# Patient Record
Sex: Male | Born: 1985 | Race: Black or African American | Hispanic: No | Smoking: Current every day smoker
Health system: Southern US, Community
[De-identification: ages and names within clinical notes are randomized; demographics above are authoritative.]

## PROBLEM LIST (undated history)

## (undated) HISTORY — PX: KELOID EXCISION: SHX1856

## (undated) HISTORY — PX: HERNIA REPAIR: SHX51

---
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
## 2009-10-20 ENCOUNTER — Emergency Department (HOSPITAL_COMMUNITY): Admission: EM | Admit: 2009-10-20 | Discharge: 2009-10-20 | Payer: Self-pay | Admitting: Emergency Medicine

## 2012-09-13 ENCOUNTER — Encounter (HOSPITAL_COMMUNITY): Payer: Self-pay | Admitting: Emergency Medicine

## 2012-09-13 ENCOUNTER — Emergency Department (HOSPITAL_COMMUNITY)
Admission: EM | Admit: 2012-09-13 | Discharge: 2012-09-13 | Discharge status: Against Medical Advice | Payer: Self-pay | Attending: Emergency Medicine | Admitting: Emergency Medicine

## 2012-09-13 DIAGNOSIS — R1031 Right lower quadrant pain: Secondary | ICD-10-CM | POA: Insufficient documentation

## 2012-09-13 DIAGNOSIS — F17200 Nicotine dependence, unspecified, uncomplicated: Secondary | ICD-10-CM | POA: Insufficient documentation

## 2012-09-13 DIAGNOSIS — N509 Disorder of male genital organs, unspecified: Secondary | ICD-10-CM | POA: Insufficient documentation

## 2012-09-13 DIAGNOSIS — R300 Dysuria: Secondary | ICD-10-CM | POA: Insufficient documentation

## 2012-09-13 DIAGNOSIS — R109 Unspecified abdominal pain: Secondary | ICD-10-CM

## 2012-09-13 LAB — URINALYSIS, MICROSCOPIC ONLY
Glucose, UA: NEGATIVE mg/dL
Ketones, ur: NEGATIVE mg/dL
Leukocytes, UA: NEGATIVE
Nitrite: NEGATIVE
Protein, ur: NEGATIVE mg/dL
Specific Gravity, Urine: 1.021 (ref 1.005–1.030)
Urobilinogen, UA: 0.2 mg/dL (ref 0.0–1.0)

## 2012-09-13 LAB — COMPREHENSIVE METABOLIC PANEL
ALT: 35 U/L (ref 0–53)
AST: 24 U/L (ref 0–37)
Calcium: 9.7 mg/dL (ref 8.4–10.5)
Chloride: 102 mEq/L (ref 96–112)
GFR calc Af Amer: >90 mL/min (ref >90)
Glucose, Bld: 107 mg/dL — ABNORMAL HIGH (ref 70–99)
Potassium: 4.2 mEq/L (ref 3.5–5.1)
Total Protein: 7.8 g/dL (ref 6.0–8.3)

## 2012-09-13 LAB — CBC WITH DIFFERENTIAL/PLATELET
Eosinophils Absolute: 0.2 10*3/uL (ref 0.0–0.7)
Eosinophils Relative: 3 % (ref 0–5)
Lymphocytes Relative: 33 % (ref 12–46)
Lymphs Abs: 2.5 10*3/uL (ref 0.7–4.0)
Monocytes Absolute: 0.5 10*3/uL (ref 0.1–1.0)
Neutrophils Relative %: 58 % (ref 43–77)
Platelets: 254 10*3/uL (ref 150–400)

## 2012-09-13 LAB — LIPASE, BLOOD: Lipase: 43 U/L (ref 11–59)

## 2012-09-13 MED ORDER — MORPHINE SULFATE 4 MG/ML IJ SOLN
4.0000 mg | Freq: Once | INTRAMUSCULAR | Status: DC
  Filled 2012-09-13: qty 1

## 2012-09-13 NOTE — ED Notes (Signed)
Patient denies pain and is resting comfortably.  

## 2012-09-13 NOTE — ED Notes (Signed)
Pt c/o RLQ pain x 3 days worse today

## 2012-09-13 NOTE — ED Provider Notes (Signed)
History     CSN: [id number]  Arrival date & time 09/13/12  1721   First MD Initiated Contact with Patient 09/13/12 2003      Chief Complaint  Patient presents with  . Abdominal Pain    (Consider location/radiation/quality/duration/timing/severity/associated sxs/prior treatment) Patient is a 27 y.o. male presenting with abdominal pain. The history is provided by the patient and medical records. No language interpreter was used.  Abdominal Pain Pain location:  RLQ Pain quality: burning   Pain radiates to:  Groin Pain severity:  Mild Onset quality:  Sudden Duration:  3 days Timing:  Intermittent Progression:  Worsening Chronicity:  New Context: not sick contacts, not suspicious food intake and not trauma   Relieved by:  Nothing Worsened by:  Urination Ineffective treatments:  None tried Associated symptoms: dysuria   Associated symptoms: no chest pain, no constipation, no cough, no diarrhea, no fatigue, no fever, no hematuria, no nausea, no shortness of breath and no vomiting     DEQUARIUS JEFFRIES is a 27 y.o. male  with no medical Hx presents to the Emergency Department complaining of gradual, persistent, progressively worsening RLQ pain onset 3 days ago.  Pt states pain is intermittent, describes pain as a cramp, rated at a 5/10, radiating into the tip of the penis with urination and resolving spontaneously Associated symptoms include dysuria, penile pain with urination.  Nothing makes it better and urination makes it worse.  Pt denies fever, chills, headaches, chest pain, SOB, nausea, vomiting, diarrhea, weakness, dizziness, syncope, hematuria, penile discharge, testicular pain.  Pt denies abdominal surgery.  Pt is a current everyday smoker, EtOH use 1x week,  Denies recreational drug use.     History reviewed. No pertinent past medical history.  History reviewed. No pertinent past surgical history.  History reviewed. No pertinent family history.  History  Substance Use  Topics  . Smoking status: Current Every Day Smoker  . Smokeless tobacco: Not on file  . Alcohol Use: Yes      Review of Systems  Constitutional: Negative for fever, diaphoresis, appetite change, fatigue and unexpected weight change.  HENT: Negative for mouth sores and neck stiffness.   Eyes: Negative for visual disturbance.  Respiratory: Negative for cough, chest tightness, shortness of breath and wheezing.   Cardiovascular: Negative for chest pain.  Gastrointestinal: Positive for abdominal pain. Negative for nausea, vomiting, diarrhea and constipation.  Endocrine: Negative for polydipsia, polyphagia and polyuria.  Genitourinary: Positive for dysuria and penile pain. Negative for urgency, frequency, hematuria, discharge, penile swelling, scrotal swelling and testicular pain.  Musculoskeletal: Negative for back pain.  Skin: Negative for rash.  Allergic/Immunologic: Negative for immunocompromised state.  Neurological: Negative for syncope, light-headedness and headaches.  Hematological: Does not bruise/bleed easily.  Psychiatric/Behavioral: Negative for sleep disturbance. The patient is not nervous/anxious.     Allergies  Review of patient's allergies indicates no known allergies.  Home Medications   Current Outpatient Rx  Name  Route  Sig  Dispense  Refill  . ibuprofen (ADVIL,MOTRIN) 200 MG tablet   Oral   Take 400 mg by mouth every 6 (six) hours as needed for pain.           BP 131/61  Pulse 67  Temp(Src) 98.4 F (36.9 C) (Oral)  Resp 20  SpO2 97%  Physical Exam  Nursing note and vitals reviewed. Constitutional: He is oriented to person, place, and time. He appears well-developed and well-nourished.  HENT:  Head: Normocephalic and atraumatic.  Mouth/Throat: Oropharynx is clear  and moist.  Eyes: Conjunctivae and EOM are normal. Pupils are equal, round, and reactive to light. No scleral icterus.  Neck: Normal range of motion and full passive range of motion without  pain. No rigidity. Normal range of motion present.  Cardiovascular: Normal rate, regular rhythm, normal heart sounds and intact distal pulses.   No murmur heard. Pulmonary/Chest: Effort normal and breath sounds normal. No respiratory distress. He has no wheezes. He has no rales. He exhibits no tenderness.  Abdominal: Soft. Normal appearance and bowel sounds are normal. He exhibits no distension and no mass. There is no hepatosplenomegaly. There is tenderness in the right lower quadrant. There is rebound, guarding and tenderness at McBurney's point. There is no rigidity and no CVA tenderness. Hernia confirmed negative in the right inguinal area and confirmed negative in the left inguinal area.  Genitourinary: Testes normal and penis normal. Right testis shows no mass, no swelling and no tenderness. Right testis is descended. Cremasteric reflex is not absent on the right side. Left testis shows no mass, no swelling and no tenderness. Left testis is descended. Cremasteric reflex is not absent on the left side. Circumcised. No phimosis, paraphimosis, hypospadias, penile erythema or penile tenderness. No discharge found.  Musculoskeletal: Normal range of motion. He exhibits no tenderness.  Lymphadenopathy:    He has no cervical adenopathy.       Right: No inguinal adenopathy present.       Left: No inguinal adenopathy present.  Neurological: He is alert and oriented to person, place, and time. He exhibits normal muscle tone. Coordination normal.  Skin: Skin is warm and dry. No rash noted. No erythema.  Psychiatric: He has a normal mood and affect. His behavior is normal.    ED Course  Procedures (including critical care time)  Labs Reviewed  COMPREHENSIVE METABOLIC PANEL - Abnormal; Notable for the following:    Glucose, Bld 107 (*)    All other components within normal limits  CBC WITH DIFFERENTIAL - Abnormal; Notable for the following:    MCHC 36.8 (*)    All other components within normal  limits  URINALYSIS, MICROSCOPIC ONLY - Abnormal; Notable for the following:    Hgb urine dipstick TRACE (*)    All other components within normal limits  LIPASE, BLOOD   No results found.   No diagnosis found.    MDM  Elnoria Howard presents with RLQ pain and rebound tenderness on exam.  Concern for colitis vs appendicitis vs STD.  Pt alert and oriented, NAD, nontoxic and nonseptic appearing. Will obtain CT scan for further evaluation.  Pt agreeable to plan.  9:11 PM Pt now requesting to leave AMA. Pt and girlfriend in the room have had a fight and he is refusing further treatment at this time.  Pt labbs reassuring.  No leukocytosis, electrolyte imbalance or acidosis noted.  UA without evidence of UTI or urethritis.  I think STD is unlikely.  GC/Chlamydia cultures pending.  Pt remains afebrile, nontoxic, nonseptic appearing, without tachycardia.  I have discussed my concerns as his provider and the possibility that this may worsen. I have specifically discussed that without further evaluation I cannot guarantee there is not a life threatening event occuring.  Pt is A&Ox4, his own POA and states understanding of my concerns and the possible consequences.  I have made pt aware that this is an AMA discharge, but he may return at any time for further evaluation and treatment.  Dr Zadie Rhine made aware.  Dahlia Client Forest Redwine, PA-C 09/13/12 2128

## 2012-09-14 NOTE — ED Provider Notes (Signed)
Medical screening examination/treatment/procedure(s) were performed by non-physician practitioner and as supervising physician I was immediately available for consultation/collaboration.   Joya Gaskins, MD 09/14/12 [phone]

## 2012-12-19 ENCOUNTER — Emergency Department (HOSPITAL_COMMUNITY): Payer: Self-pay

## 2012-12-19 ENCOUNTER — Encounter (HOSPITAL_COMMUNITY): Payer: Self-pay | Admitting: Emergency Medicine

## 2012-12-19 ENCOUNTER — Emergency Department (HOSPITAL_COMMUNITY)
Admission: EM | Admit: 2012-12-19 | Discharge: 2012-12-20 | Discharge status: Against Medical Advice | Payer: Self-pay | Attending: Emergency Medicine | Admitting: Emergency Medicine

## 2012-12-19 DIAGNOSIS — F17200 Nicotine dependence, unspecified, uncomplicated: Secondary | ICD-10-CM | POA: Insufficient documentation

## 2012-12-19 DIAGNOSIS — Y9383 Activity, rough housing and horseplay: Secondary | ICD-10-CM | POA: Insufficient documentation

## 2012-12-19 DIAGNOSIS — S59909A Unspecified injury of unspecified elbow, initial encounter: Secondary | ICD-10-CM | POA: Insufficient documentation

## 2012-12-19 DIAGNOSIS — X58XXXA Exposure to other specified factors, initial encounter: Secondary | ICD-10-CM | POA: Insufficient documentation

## 2012-12-19 DIAGNOSIS — Y929 Unspecified place or not applicable: Secondary | ICD-10-CM | POA: Insufficient documentation

## 2012-12-19 DIAGNOSIS — S6990XA Unspecified injury of unspecified wrist, hand and finger(s), initial encounter: Secondary | ICD-10-CM | POA: Insufficient documentation

## 2012-12-19 NOTE — ED Notes (Signed)
PT. REPORTS LEFT ELBOW INJURY Andrew Dorsey / SWELLING ONSET THIS EVENING SUSTAINED WHILE "PLAYING AROUND" .

## 2012-12-20 NOTE — ED Notes (Signed)
Called patient multiple times with no answer. Patient has left without being seen after triage

## 2014-04-27 IMAGING — CR DG ELBOW COMPLETE 3+V*L*
4 series · 4 of 4 positions shown · non-contrast
Comparison: 10/20/2009

CLINICAL DATA: Hyperextension injury to the left elbow with pain
and swelling.  Patient positioning is limited due to decreased
range of motion.

LEFT ELBOW - COMPLETE 3+ VIEW

[x elbow joint obl. left (1 of 3)]
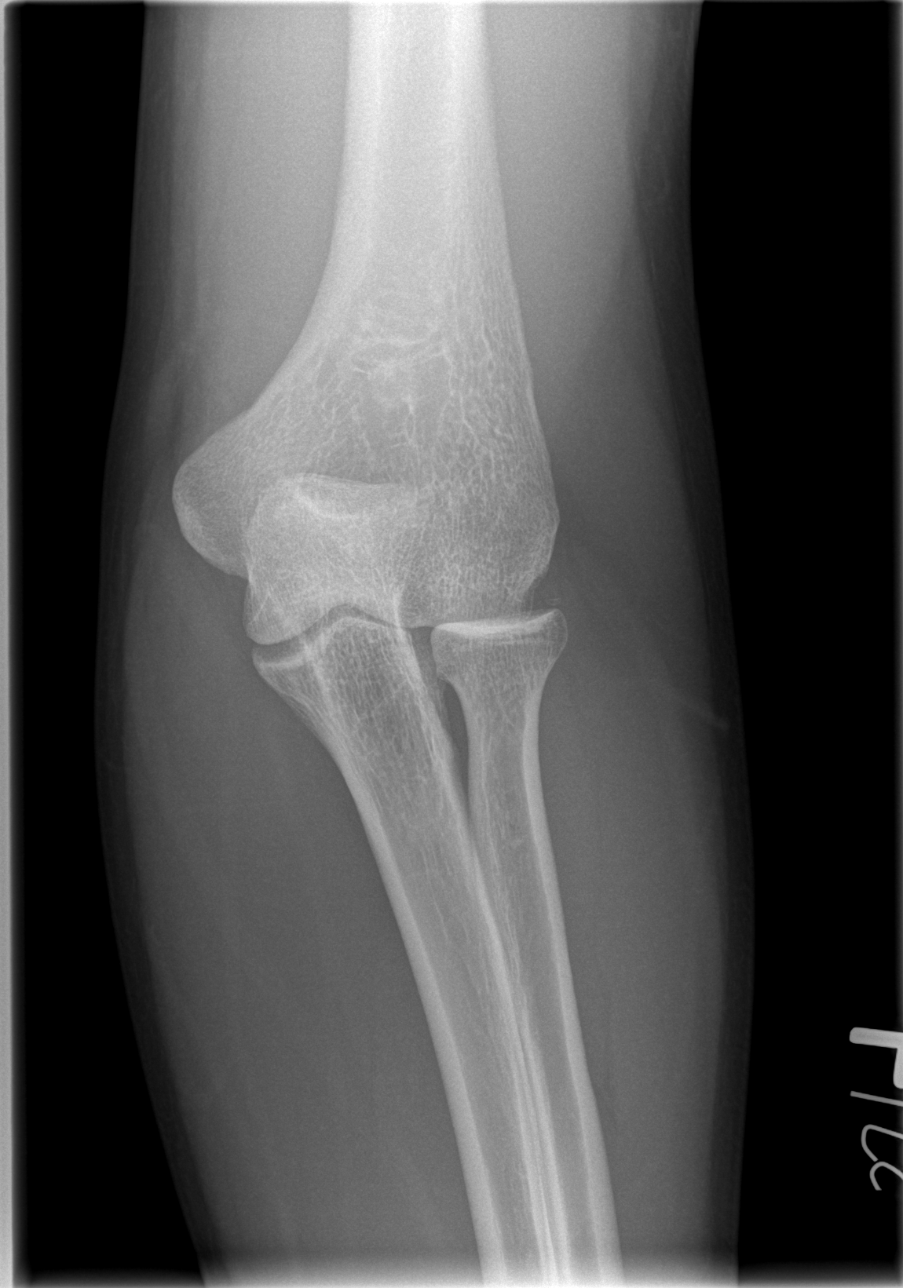

[x elbow joint lat left]
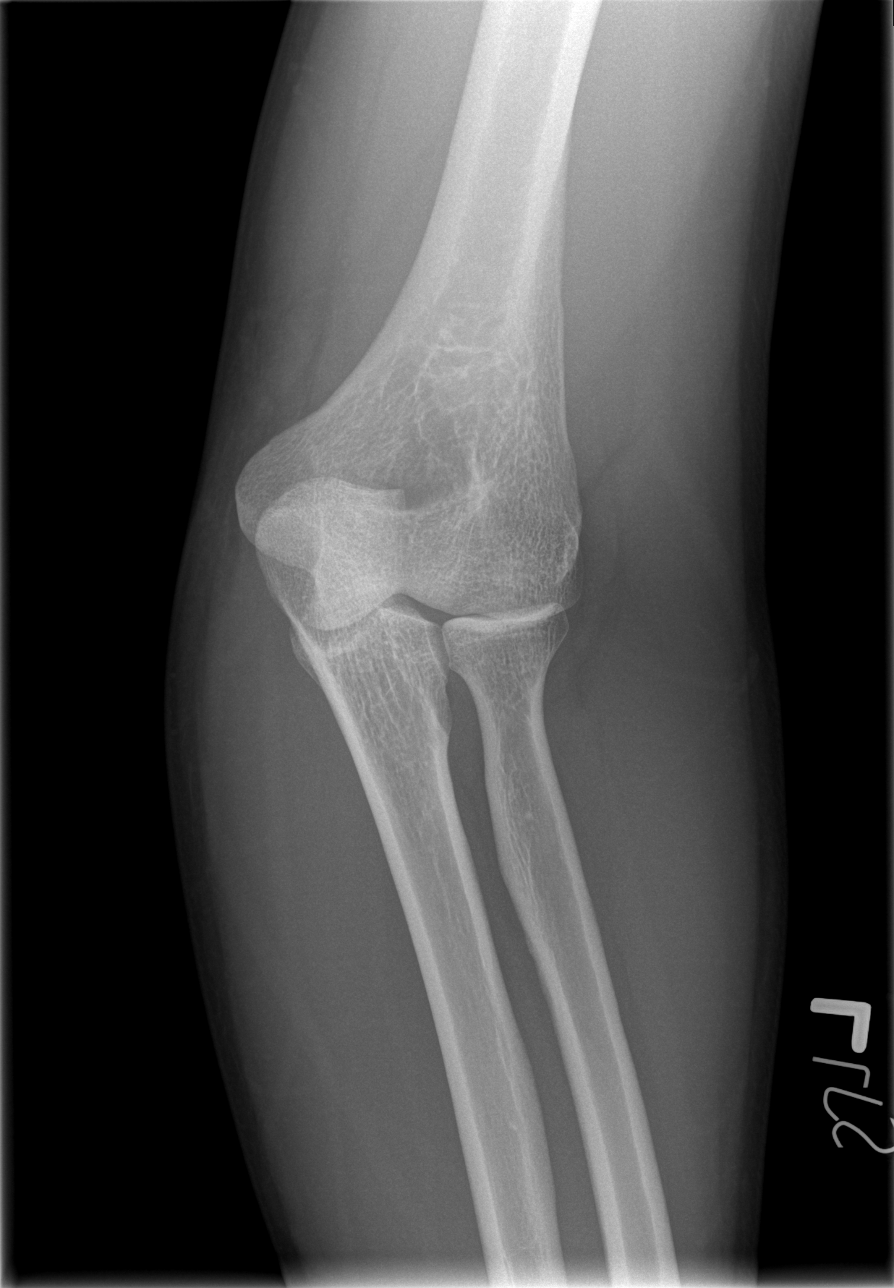

[x elbow joint obl. left (2 of 3)]
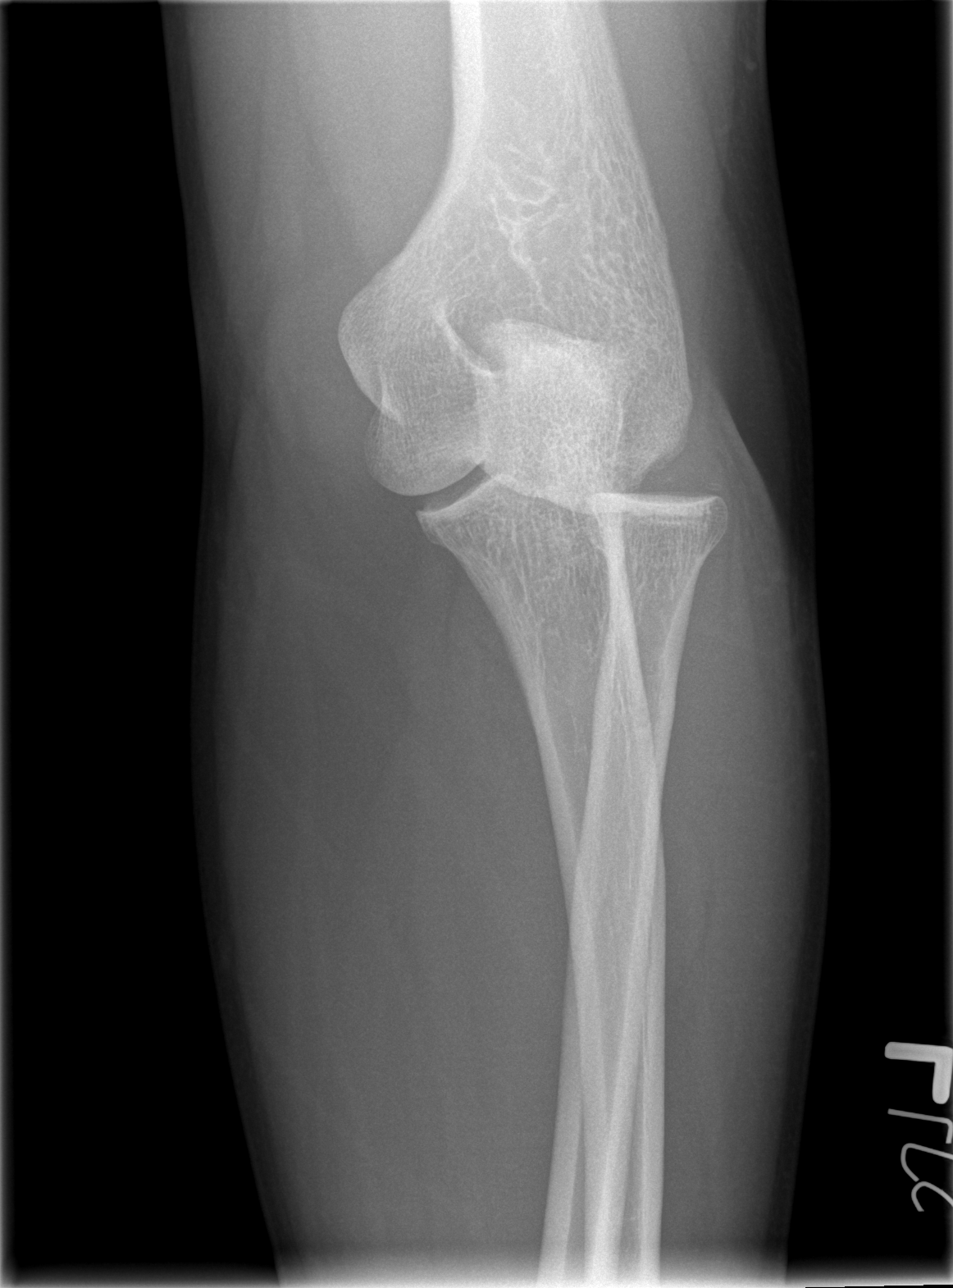

[x elbow joint obl. left (3 of 3)]
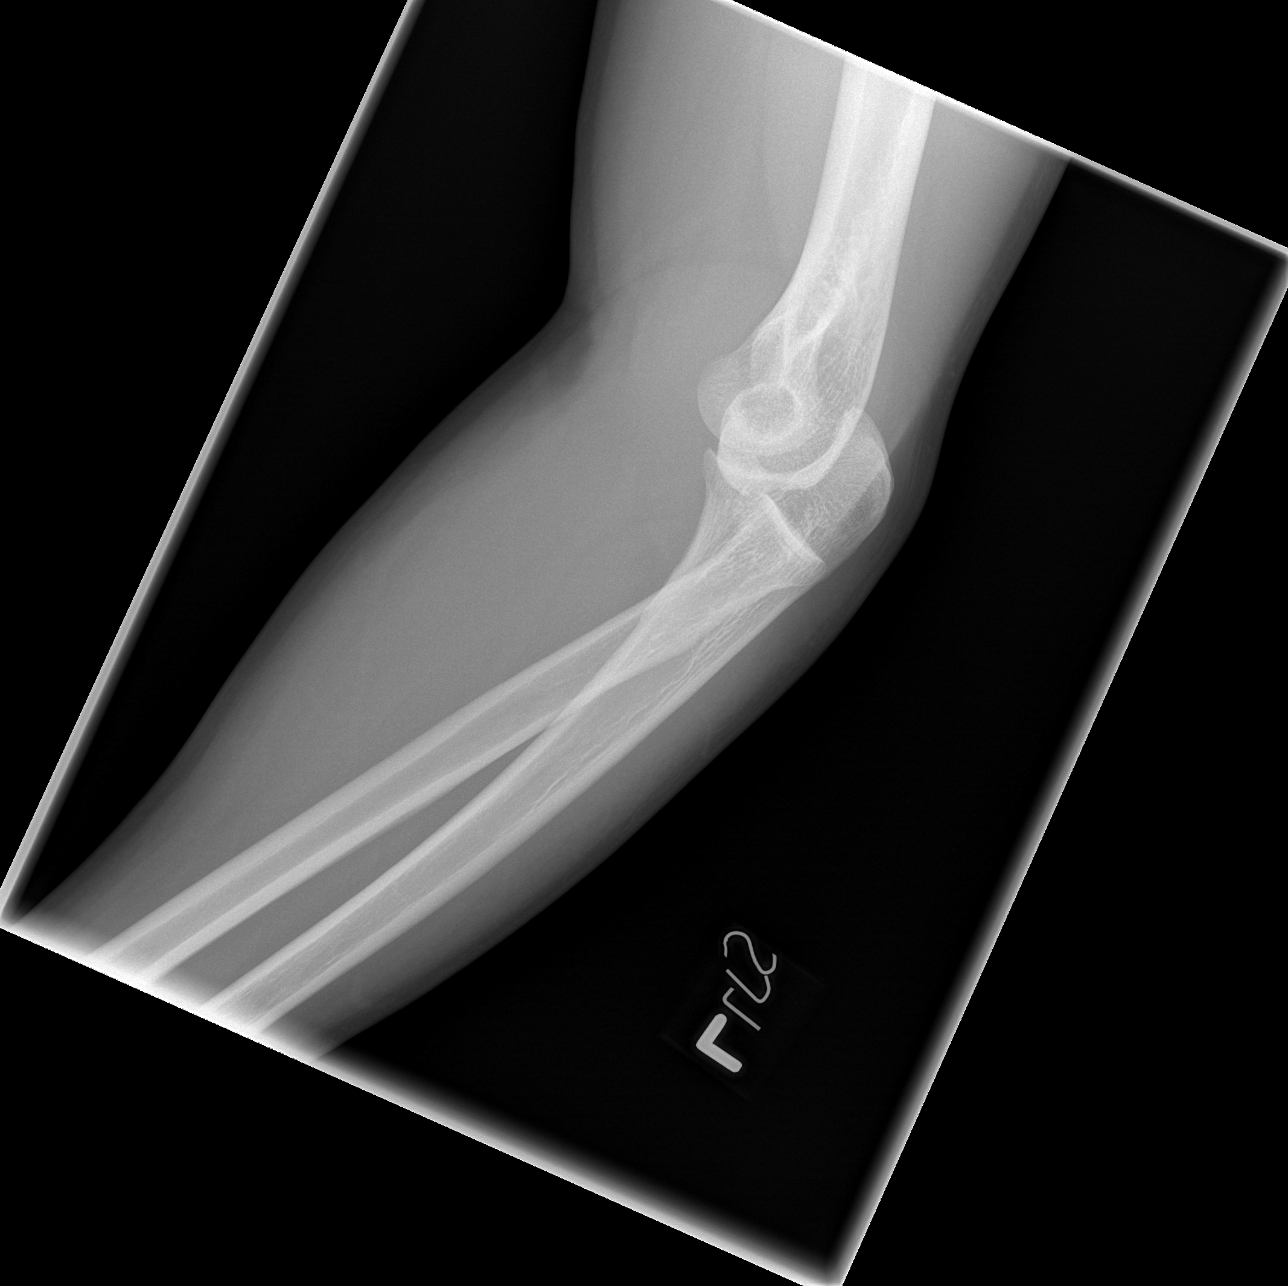

[4 of 4 positions shown; findings below may reference images not displayed]

FINDINGS: There is posterior dislocation of the radial head with
respect to the capitellum.  Apparent impaction of the anterior
aspect of the radial head on the capitellum with focal bone defect
likely representing impaction fracture.  The ulna appears to be
well aligned.
IMPRESSION: Posterior dislocation of the radial head with respect to the
capitellum with associated impaction fracture suggested.

## 2016-05-30 HISTORY — PX: HERNIA REPAIR: SHX51

## 2021-02-08 ENCOUNTER — Other Ambulatory Visit: Payer: Self-pay

## 2021-02-08 ENCOUNTER — Emergency Department (HOSPITAL_COMMUNITY)
Admission: EM | Admit: 2021-02-08 | Discharge: 2021-02-08 | Disposition: A | Discharge status: Home / Self Care | Payer: Medicaid Other | Attending: Emergency Medicine | Admitting: Emergency Medicine

## 2021-02-08 DIAGNOSIS — R1032 Left lower quadrant pain: Secondary | ICD-10-CM | POA: Insufficient documentation

## 2021-02-08 DIAGNOSIS — Z5321 Procedure and treatment not carried out due to patient leaving prior to being seen by health care provider: Secondary | ICD-10-CM | POA: Insufficient documentation

## 2021-02-08 NOTE — ED Notes (Signed)
Patient had to leave to pick up other kids

## 2021-02-08 NOTE — ED Provider Notes (Signed)
Emergency Medicine Provider Triage Evaluation Note  Andrew Dorsey , a 35 y.o. male  was evaluated in triage.  Pt complains of left sided groin pain worsened with bowel movements and urination for the last 3 days.  History of inguinal hernia repaired in 2018 on the left side.  Denies any fevers, chills, protruding lump in the area, nausea, or vomiting..  Review of Systems  Positive: Left-sided groin pain rating down into the scrotum Negative: Penile or scrotal swelling or skin changes  Physical Exam  BP 139/78 (BP Location: Right Arm)   Pulse 83   Temp 99.1 F (37.3 C)   Resp 17   Ht 5\' 10"  (1.778 m)   Wt 90.7 kg   SpO2 95%   BMI 28.70 kg/m  Gen:   Awake, no distress   Resp:  Normal effort  MSK:   Moves extremities without difficulty  Other:  Tenderness palpation in the left lower quadrant and suprapubic areas  Medical Decision Making  Medically screening exam initiated at 11:49 AM.  Appropriate orders placed.  Andrew Dorsey was informed that the remainder of the evaluation will be completed by another provider, this initial triage assessment does not replace that evaluation, and the importance of remaining in the ED until their evaluation is complete.  Genital exam deferred in triage, patient with his infant in the emergency department.  Warrants a more thorough physical exam prior to ordering imaging.  This chart was dictated using voice recognition software, Dragon. Despite the best efforts of this provider to proofread and correct errors, errors may still occur which can change documentation meaning.    Elnoria Howard, PA-C 02/08/21 1150    04/10/21 A, DO 02/08/21 1813

## 2021-02-08 NOTE — ED Triage Notes (Signed)
Pt arrived c/o left groin pain. Pt has hernia surgery in 2018 and is having pain in that same location. Denies protruding lump   States pain is worse with movement and using the bathroom

## 2021-09-17 ENCOUNTER — Telehealth: Payer: Medicaid Other | Admitting: Physician Assistant

## 2021-09-17 DIAGNOSIS — A599 Trichomoniasis, unspecified: Secondary | ICD-10-CM | POA: Diagnosis not present

## 2021-09-17 MED ORDER — METRONIDAZOLE 500 MG PO TABS: 500.0000 mg | ORAL_TABLET | Freq: Two times a day (BID) | ORAL | 0 refills | Status: AC

## 2021-09-17 NOTE — Progress Notes (Signed)
Virtual Visit Consent   ERYX FARRUGGIA, you are scheduled for a virtual visit with a [facility] provider today.     Just as with appointments in the office, your consent must be obtained to participate.  Your consent will be active for this visit and any virtual visit you may have with one of our providers in the next 365 days.     If you have a MyChart account, a copy of this consent can be sent to you electronically.  All virtual visits are billed to your insurance company just like a traditional visit in the office.    As this is a virtual visit, video technology does not allow for your provider to perform a traditional examination.  This may limit your provider's ability to fully assess your condition.  If your provider identifies any concerns that need to be evaluated in person or the need to arrange testing (such as labs, EKG, etc.), we will make arrangements to do so.     Although advances in technology are sophisticated, we cannot ensure that it will always work on either your end or our end.  If the connection with a video visit is poor, the visit may have to be switched to a telephone visit.  With either a video or telephone visit, we are not always able to ensure that we have a secure connection.     Also, by engaging in this virtual visit, you consent to the provision of healthcare. Additionally, you authorize for your insurance to be billed (if applicable) for the services provided during this visit.   I need to obtain your verbal consent now.   Are you willing to proceed with your visit today?    [name] has provided verbal consent on 09/17/2021 for a virtual visit (video or telephone).   Mar Daring, PA-C   Date: 09/17/2021 1:01 PM   Virtual Visit via Video Note   I, Mar Daring, connected with  Andrew Dorsey  ([id number], 13-Mar-1986) on 09/17/21 at  1:00 PM EDT by a video-enabled telemedicine application and verified that I am speaking with the  correct person using two identifiers.  Location: Patient: Virtual Visit Location Patient: Home Provider: Virtual Visit Location Provider: Home Office   I discussed the limitations of evaluation and management by telemedicine and the availability of in person appointments. The patient expressed understanding and agreed to proceed.    History of Present Illness: Andrew Dorsey is a 36 y.o. who identifies as a male who was assigned male at birth, and is being seen today for STI exposure. Partner has tested positive for Trichomoniasis.  He is not having any symptoms.    Problems: There are no problems to display for this patient.   Allergies: No Known Allergies Medications:  Current Outpatient Medications:    metroNIDAZOLE (FLAGYL) 500 MG tablet, Take 1 tablet (500 mg total) by mouth 2 (two) times daily for 7 days., Disp: 14 tablet, Rfl: 0   ibuprofen (ADVIL,MOTRIN) 200 MG tablet, Take 400 mg by mouth every 6 (six) hours as needed for pain., Disp: , Rfl:   Observations/Objective: Patient is well-developed, well-nourished in no acute distress.  Resting comfortably at home.  Head is normocephalic, atraumatic.  No labored breathing.  Speech is clear and coherent with logical content.  Patient is alert and oriented at baseline.    Assessment and Plan: 1. Trichomoniasis - metroNIDAZOLE (FLAGYL) 500 MG tablet; Take 1 tablet (500 mg total) by mouth  2 (two) times daily for 7 days.  Dispense: 14 tablet; Refill: 0  - Metronidazole prescribed - Avoid sexual intercourse while completing treatment - Safe sax practices discussed - Discussed consideration of testing if symptoms develop at anytime - Seek in person evaluation if symptoms develop  Follow Up Instructions: I discussed the assessment and treatment plan with the patient. The patient was provided an opportunity to ask questions and all were answered. The patient agreed with the plan and demonstrated an understanding of the  instructions.  A copy of instructions were sent to the patient via MyChart unless otherwise noted below.    The patient was advised to call back or seek an in-person evaluation if the symptoms worsen or if the condition fails to improve as anticipated.  Time:  I spent 10 minutes with the patient via telehealth technology discussing the above problems/concerns.    Mar Daring, PA-C

## 2021-09-17 NOTE — Patient Instructions (Signed)
?[name], thank you for joining Mar Daring, PA-C for today's virtual visit.  While this provider is not your primary care provider (PCP), if your PCP is located in our provider database this encounter information will be shared with them immediately following your visit. ? ?Consent: ?(Patient) Andrew Dorsey provided verbal consent for this virtual visit at the beginning of the encounter. ? ?Current Medications: ? ?Current Outpatient Medications:  ?  metroNIDAZOLE (FLAGYL) 500 MG tablet, Take 1 tablet (500 mg total) by mouth 2 (two) times daily for 7 days., Disp: 14 tablet, Rfl: 0 ?  ibuprofen (ADVIL,MOTRIN) 200 MG tablet, Take 400 mg by mouth every 6 (six) hours as needed for pain., Disp: , Rfl:   ? ?Medications ordered in this encounter:  ?Meds ordered this encounter  ?Medications  ? metroNIDAZOLE (FLAGYL) 500 MG tablet  ?  Sig: Take 1 tablet (500 mg total) by mouth 2 (two) times daily for 7 days.  ?  Dispense:  14 tablet  ?  Refill:  0  ?  Order Specific Question:   Supervising Provider  ?  Answer:   Andrew Dorsey [[id number]]  ?  ? ?*If you need refills on other medications prior to your next appointment, please contact your pharmacy* ? ?Follow-Up: ?Call back or seek an in-person evaluation if the symptoms worsen or if the condition fails to improve as anticipated. ? ?Other Instructions ? ?Trichomoniasis ?Trichomoniasis is a sexually transmitted infection (STI). Many people with trichomoniasis do not have any symptoms (are asymptomatic) or have only minimal symptoms. Untreated trichomoniasis can last from months to years. This condition is treated with medicine. ?What are the causes? ?This condition is caused by a parasite called Trichomonas vaginalis and is transmitted during sexual contact. ?What increases the risk? ?The following factors may make you more likely to develop this condition: ?Having unprotected sex. ?Having sex with a partner who has trichomoniasis. ?Having multiple sexual  partners. ?Having had previous trichomoniasis infections or other STIs. ?What are the signs or symptoms? ?In females, symptoms of trichomoniasis include: ?Itching, burning, redness, or soreness in the genital area. ?Discomfort while urinating. ?Abnormal vaginal discharge that is clear, white, gray, or yellow-green and foamy and has an unusual fishy odor. ?In males, symptoms of trichomoniasis include: ?Discharge from the penis. ?Burning after urination or ejaculation. ?Itching or discomfort inside the penis. ?How is this diagnosed? ?This condition is diagnosed based on tests. To perform a test, your health care provider will do one of the following: ?Ask you to provide a urine sample. ?Take a sample of discharge. The sample may be taken from the vagina or cervix in females and from the urethra in males. Your health care provider may use a swab to collect the sample. ?Your health care provider may test you for other STIs, including human immunodeficiency virus (HIV). ?How is this treated? ? ?This condition is treated with medicines such as metronidazole or tinidazole. These are called antimicrobial medicines, and they are taken by mouth (orally). Your sexual partner or partners also need to be tested and treated. If they have the infection and are not treated, you will likely get reinfected. ?If you plan to become pregnant or think you may be pregnant, tell your health care provider right away. Some medicines that are used to treat the infection should not be taken during pregnancy. ?Your health care provider may recommend over-the-counter medicines or creams to help relieve itching or irritation. You may be tested for the infection again 3 months  after treatment. ?Follow these instructions at home: ?Medicines ?Take over-the-counter and prescription medicines only as told by your health care provider. ?Take your antimicrobial medicine as told by your health care provider. Do not stop taking it even if you start to  feel better. ?Use creams as told by your health care provider. ?General instructions ?Do not have sex until after you finish your medicine and your symptoms have resolved. ?Do not wear tampons while you have the infection (if you are male). ?Talk with your sexual partner or partners about any symptoms that either of you may have, as well as any history of STIs. ?Keep all follow-up visits. This is important. ?How is this prevented? ? ?Use condoms every time you have sex. Using condoms correctly and consistently can help protect against STIs. ?Do not have sexual contact if you have symptoms of trichomoniasis or another STI. ?Avoid having multiple sexual partners. ?Get tested for STIs before you have sex with a partner. Ask all partners to do the same. ?Do not douche (if you are male). Douching may increase your risk for getting STIs due to the removal of good bacteria in the vagina. ?Contact a health care provider if: ?You still have symptoms after you finish your medicine. ?You develop a rash. ?You plan to become pregnant or think you may be pregnant. ?Summary ?Trichomoniasis is a sexually transmitted infection (STI). This condition often has no symptoms or minimal symptoms. ?Take your antimicrobial medicine as told by your health care provider. Do not stop taking even if you start to feel better. ?Discuss your infection with your sexual partner or partners. Make sure that all partners get tested and treated, if necessary. ?You should not have sex until after you finish your medicine and your symptoms have resolved. ?Keep all follow-up visits. This is important. ?This information is not intended to replace advice given to you by your health care provider. Make sure you discuss any questions you have with your health care provider. ?Document Revised: 04/14/2021 Document Reviewed: 04/14/2021 ?Elsevier Patient Education ? [place]. ? ? ? ?If you have been instructed to have an in-person evaluation today at  a local Urgent Care facility, please use the link below. It will take you to a list of all of our available [facility], including address, phone number and hours of operation. Please do not delay care.  ?[facility] ? ?If you or a family member do not have a primary care provider, use the link below to schedule a visit and establish care. When you choose a [facility] primary care physician or advanced practice provider, you gain a long-term partner in health. ?Find a Primary Care Provider ? ?Learn more about 's in-office and virtual care options: ?[facility] ?

## 2021-10-31 ENCOUNTER — Telehealth: Payer: Medicaid Other | Admitting: Nurse Practitioner

## 2021-10-31 DIAGNOSIS — B302 Viral pharyngoconjunctivitis: Secondary | ICD-10-CM

## 2021-10-31 MED ORDER — IBUPROFEN 600 MG PO TABS: 600.0000 mg | ORAL_TABLET | Freq: Three times a day (TID) | ORAL | 0 refills | Status: DC | PRN

## 2021-10-31 NOTE — Patient Instructions (Signed)
  Andrew Dorsey, thank you for joining Gildardo Pounds, NP for today's virtual visit.  While this provider is not your primary care provider (PCP), if your PCP is located in our provider database this encounter information will be shared with them immediately following your visit.  Consent: (Patient) Andrew Dorsey provided verbal consent for this virtual visit at the beginning of the encounter.  Current Medications:  Current Outpatient Medications:    ibuprofen (ADVIL) 600 MG tablet, Take 1 tablet (600 mg total) by mouth every 8 (eight) hours as needed., Disp: 60 tablet, Rfl: 0   Medications ordered in this encounter:  Meds ordered this encounter  Medications   ibuprofen (ADVIL) 600 MG tablet    Sig: Take 1 tablet (600 mg total) by mouth every 8 (eight) hours as needed.    Dispense:  60 tablet    Refill:  0    Order Specific Question:   Supervising Provider    Answer:   Sabra Heck, BRIAN [[id number]]     *If you need refills on other medications prior to your next appointment, please contact your pharmacy*  Follow-Up: Call back or seek an in-person evaluation if the symptoms worsen or if the condition fails to improve as anticipated.  Other Instructions Warm salt water gargles for pain relief.    If you have been instructed to have an in-person evaluation today at a local Urgent Care facility, please use the link below. It will take you to a list of all of our available [facility], including address, phone number and hours of operation. Please do not delay care.  [facility]  If you or a family member do not have a primary care provider, use the link below to schedule a visit and establish care. When you choose a [facility] primary care physician or advanced practice provider, you gain a long-term partner in health. Find a Primary Care Provider  Learn more about [facility] in-office and virtual care options: [facility]

## 2021-10-31 NOTE — Progress Notes (Signed)
Virtual Visit Consent   Andrew Dorsey, you are scheduled for a virtual visit with a [facility] provider today. Just as with appointments in the office, your consent must be obtained to participate. Your consent will be active for this visit and any virtual visit you may have with one of our providers in the next 365 days. If you have a MyChart account, a copy of this consent can be sent to you electronically.  As this is a virtual visit, video technology does not allow for your provider to perform a traditional examination. This may limit your provider's ability to fully assess your condition. If your provider identifies any concerns that need to be evaluated in person or the need to arrange testing (such as labs, EKG, etc.), we will make arrangements to do so. Although advances in technology are sophisticated, we cannot ensure that it will always work on either your end or our end. If the connection with a video visit is poor, the visit may have to be switched to a telephone visit. With either a video or telephone visit, we are not always able to ensure that we have a secure connection.  By engaging in this virtual visit, you consent to the provision of healthcare and authorize for your insurance to be billed (if applicable) for the services provided during this visit. Depending on your insurance coverage, you may receive a charge related to this service.  I need to obtain your verbal consent now. Are you willing to proceed with your visit today? TEIGE GARFIAS has provided verbal consent on 10/31/2021 for a virtual visit (video or telephone). Claiborne Rigg, NP  Date: 10/31/2021 5:57 PM  Virtual Visit via Video Note   I, Claiborne Rigg, connected with  Andrew Dorsey  ([id number], September 24, 1985) on 10/31/21 at  5:30 PM EDT by a video-enabled telemedicine application and verified that I am speaking with the correct person using two identifiers.  Location: Patient: Virtual Visit Location Patient:  Mobile Provider: Virtual Visit Location Provider: Home Office   I discussed the limitations of evaluation and management by telemedicine and the availability of in person appointments. The patient expressed understanding and agreed to proceed.    History of Present Illness: Andrew Dorsey is a 36 y.o. who identifies as a male who was assigned male at birth, and is being seen today for viral pharyngitis.  States he woke up this morning with a swollen "uvula" and was worried something was wrong. He is able to clear secretions, does not endorse any difficulty breathing and he is able to swallow foods and liquids with minimal pain. Denies fever or any URI symptoms.   Problems: There are no problems to display for this patient.   Allergies: No Known Allergies Medications:  Current Outpatient Medications:    ibuprofen (ADVIL) 600 MG tablet, Take 1 tablet (600 mg total) by mouth every 8 (eight) hours as needed., Disp: 60 tablet, Rfl: 0  Observations/Objective: Patient is well-developed, well-nourished in no acute distress.  Sitting in car Head is normocephalic, atraumatic.  No labored breathing.  Speech is clear and coherent with logical content.  Patient is alert and oriented at baseline.    Assessment and Plan: 1. Viral pharyngoconjunctivitis - ibuprofen (ADVIL) 600 MG tablet; Take 1 tablet (600 mg total) by mouth every 8 (eight) hours as needed.  Dispense: 60 tablet; Refill: 0 Warm salt water gargles for relief of pain  Follow Up Instructions: I discussed the assessment and treatment plan  with the patient. The patient was provided an opportunity to ask questions and all were answered. The patient agreed with the plan and demonstrated an understanding of the instructions.  A copy of instructions were sent to the patient via MyChart unless otherwise noted below.   The patient was advised to call back or seek an in-person evaluation if the symptoms worsen or if the condition fails to  improve as anticipated.  Time:  I spent 11 minutes with the patient via telehealth technology discussing the above problems/concerns.    Gildardo Pounds, NP

## 2022-05-26 ENCOUNTER — Encounter (HOSPITAL_COMMUNITY): Payer: Self-pay | Admitting: Emergency Medicine

## 2022-05-26 ENCOUNTER — Ambulatory Visit (HOSPITAL_COMMUNITY)
Admission: EM | Admit: 2022-05-26 | Discharge: 2022-05-26 | Disposition: A | Discharge status: Home / Self Care | Payer: Medicaid Other | Attending: Physician Assistant | Admitting: Physician Assistant

## 2022-05-26 DIAGNOSIS — R197 Diarrhea, unspecified: Secondary | ICD-10-CM | POA: Diagnosis not present

## 2022-05-26 DIAGNOSIS — Z1152 Encounter for screening for COVID-19: Secondary | ICD-10-CM | POA: Insufficient documentation

## 2022-05-26 DIAGNOSIS — J069 Acute upper respiratory infection, unspecified: Secondary | ICD-10-CM | POA: Diagnosis not present

## 2022-05-26 DIAGNOSIS — R1032 Left lower quadrant pain: Secondary | ICD-10-CM | POA: Insufficient documentation

## 2022-05-26 DIAGNOSIS — F17200 Nicotine dependence, unspecified, uncomplicated: Secondary | ICD-10-CM | POA: Insufficient documentation

## 2022-05-26 DIAGNOSIS — Z8616 Personal history of COVID-19: Secondary | ICD-10-CM | POA: Insufficient documentation

## 2022-05-26 DIAGNOSIS — R059 Cough, unspecified: Secondary | ICD-10-CM | POA: Diagnosis present

## 2022-05-26 LAB — POC INFLUENZA A AND B ANTIGEN (URGENT CARE ONLY)
INFLUENZA A ANTIGEN, POC: NEGATIVE
INFLUENZA B ANTIGEN, POC: NEGATIVE

## 2022-05-26 MED ORDER — PROMETHAZINE-DM 6.25-15 MG/5ML PO SYRP: 5.0000 mL | ORAL_SOLUTION | Freq: Four times a day (QID) | ORAL | 0 refills | Status: DC | PRN

## 2022-05-26 MED ORDER — FLUTICASONE PROPIONATE 50 MCG/ACT NA SUSP: 1.0000 | Freq: Every day | NASAL | 0 refills | Status: DC

## 2022-05-26 NOTE — ED Triage Notes (Signed)
Started having cough and diarrhea last night.  Pt hx hernia years ago. Reports when coughing started up started having LLQ pains. Took Mucinex earlier today.

## 2022-05-26 NOTE — ED Provider Notes (Signed)
[facility]    CSN: [id number] Arrival date & time: 05/26/22  1724      History   Chief Complaint Chief Complaint  Patient presents with   Cough   Diarrhea   Abdominal Pain    HPI Andrew Dorsey is a 36 y.o. male.   Patient presented with a 24-hour history of URI symptoms including cough, diarrhea, 1 episode of nausea/vomiting, congestion.  He reports abdominal pain is left lower quadrant where he had a hernia repair in the past.  This is only present with coughing.  He has tried Mucinex without improvement of symptoms.  Denies any known sick contacts.  He has not had influenza vaccine but has had COVID vaccinations.  He has had COVID several years ago.  He denies history of asthma, allergies, COPD.  He does smoke.  Denies any recent antibiotics or steroids.  He has been able to eat a breakfast biscuit though it took him a longer time than normal as he had no appetite.    History reviewed. No pertinent past medical history.  There are no problems to display for this patient.   Past Surgical History:  Procedure Laterality Date   HERNIA REPAIR         Home Medications    Prior to Admission medications   Medication Sig Start Date End Date Taking? Authorizing Provider  fluticasone (FLONASE) 50 MCG/ACT nasal spray Place 1 spray into both nostrils daily. 05/26/22  Yes Maleeyah Mccaughey K, PA-C  promethazine-dextromethorphan (PROMETHAZINE-DM) 6.25-15 MG/5ML syrup Take 5 mLs by mouth 4 (four) times daily as needed for cough. 05/26/22  Yes Debrina Kizer K, PA-C  ibuprofen (ADVIL) 600 MG tablet Take 1 tablet (600 mg total) by mouth every 8 (eight) hours as needed. 10/31/21   Claiborne Rigg, NP    Family History History reviewed. No pertinent family history.  Social History Social History   Tobacco Use   Smoking status: Every Day  Substance Use Topics   Alcohol use: Yes   Drug use: Yes    Types: Marijuana     Allergies   Patient has no known  allergies.   Review of Systems Review of Systems  Constitutional:  Positive for activity change and fatigue. Negative for appetite change and fever.  HENT:  Positive for congestion and sore throat. Negative for sinus pressure and sneezing.   Respiratory:  Positive for cough. Negative for shortness of breath.   Cardiovascular:  Negative for chest pain.  Gastrointestinal:  Positive for abdominal pain, diarrhea and vomiting. Negative for nausea.  Musculoskeletal:  Negative for arthralgias and myalgias.  Neurological:  Positive for headaches. Negative for dizziness and light-headedness.     Physical Exam Triage Vital Signs ED Triage Vitals  Enc Vitals Group     BP 05/26/22 1941 119/79     Pulse Rate 05/26/22 1941 64     Resp 05/26/22 1941 15     Temp 05/26/22 1941 98.8 F (37.1 C)     Temp src --      SpO2 05/26/22 1941 97 %     Weight --      Height --      Head Circumference --      Peak Flow --      Pain Score 05/26/22 1940 7     Pain Loc --      Pain Edu? --      Excl. in GC? --    No data found.  Updated Vital Signs  BP 119/79 (BP Location: Left Arm)   Pulse 64   Temp 98.8 F (37.1 C)   Resp 15   SpO2 97%   Visual Acuity Right Eye Distance:   Left Eye Distance:   Bilateral Distance:    Right Eye Near:   Left Eye Near:    Bilateral Near:     Physical Exam Vitals reviewed.  Constitutional:      General: He is awake.     Appearance: Normal appearance. He is well-developed. He is not ill-appearing.     Comments: Very pleasant male appears stated age in no acute distress sitting comfortably in exam room  HENT:     Head: Normocephalic and atraumatic.     Right Ear: Tympanic membrane, ear canal and external ear normal. Tympanic membrane is not erythematous or bulging.     Left Ear: Tympanic membrane, ear canal and external ear normal. Tympanic membrane is not erythematous or bulging.     Nose: Nose normal.     Mouth/Throat:     Pharynx: Uvula midline.  Posterior oropharyngeal erythema present. No oropharyngeal exudate.  Cardiovascular:     Rate and Rhythm: Normal rate and regular rhythm.     Heart sounds: Normal heart sounds, S1 normal and S2 normal. No murmur heard. Pulmonary:     Effort: Pulmonary effort is normal. No accessory muscle usage or respiratory distress.     Breath sounds: Normal breath sounds. No stridor. No wheezing, rhonchi or rales.     Comments: Clear to auscultation bilaterally Abdominal:     General: Bowel sounds are normal.     Palpations: Abdomen is soft.     Tenderness: There is no abdominal tenderness. There is no right CVA tenderness, left CVA tenderness, guarding or rebound.  Neurological:     Mental Status: He is alert.  Psychiatric:        Behavior: Behavior is cooperative.      UC Treatments / Results  Labs (all labs ordered are listed, but only abnormal results are displayed) Labs Reviewed  SARS CORONAVIRUS 2 (TAT 6-24 HRS)  POC INFLUENZA A AND B ANTIGEN (URGENT CARE ONLY)    EKG   Radiology No results found.  Procedures Procedures (including critical care time)  Medications Ordered in UC Medications - No data to display  Initial Impression / Assessment and Plan / UC Course  I have reviewed the triage vital signs and the nursing notes.  Pertinent labs & imaging results that were available during my care of the patient were reviewed by me and considered in my medical decision making (see chart for details).     Patient is well-appearing, afebrile, nontoxic, nontachycardic.  No evidence of acute infection on physical exam that would warrant initiation of antibiotics.  Flu testing was negative in clinic.  COVID test is pending.  He is young and otherwise healthy so we will defer COVID antivirals if positive.  He was started on Promethazine DM for cough.  Discussed that this can be sedating and he is not to drive or drink alcohol with taking it.  He can use Flonase for congestion.   Recommended Mucinex, Tylenol, ibuprofen for additional symptom relief.  He is to rest and drink plenty of fluid.  Discussed that if his symptoms or not improving within a week he should return for reevaluation.  If he has any worsening symptoms including chest pain, shortness of breath, fever, nausea/vomiting interfere with oral intake, weakness he needs to be seen immediately.  Strict return  precautions given.  Work excuse note with current CDC return to work guidelines provided during visit today.  Final Clinical Impressions(s) / UC Diagnoses   Final diagnoses:  Upper respiratory tract infection, unspecified type     Discharge Instructions      You tested negative for flu.  We will contact you if you are positive for COVID.  Please monitor your MyChart for these results.  Make sure you are resting and drinking plenty of fluid.  Use Promethazine DM for cough.  This make you sleepy so do not drive or drink alcohol while taking it.  Use Flonase for congestion.  Use over-the-counter medication including Mucinex, Tylenol, ibuprofen for additional symptom relief.  If your symptoms do not improving within a week please return for reevaluation.  If anything worsens you need to be seen immediately including chest pain, shortness of breath, nausea/vomiting interfere with oral intake, weakness, worsening cough you need to be seen immediately.     ED Prescriptions     Medication Sig Dispense Auth. Provider   promethazine-dextromethorphan (PROMETHAZINE-DM) 6.25-15 MG/5ML syrup Take 5 mLs by mouth 4 (four) times daily as needed for cough. 118 mL Lynn Recendiz K, PA-C   fluticasone (FLONASE) 50 MCG/ACT nasal spray Place 1 spray into both nostrils daily. 16 g Eisha Chatterjee K, PA-C      PDMP not reviewed this encounter.   Terrilee Croak, PA-C 05/26/22 2059

## 2022-05-26 NOTE — Discharge Instructions (Signed)
You tested negative for flu.  We will contact you if you are positive for COVID.  Please monitor your MyChart for these results.  Make sure you are resting and drinking plenty of fluid.  Use Promethazine DM for cough.  This make you sleepy so do not drive or drink alcohol while taking it.  Use Flonase for congestion.  Use over-the-counter medication including Mucinex, Tylenol, ibuprofen for additional symptom relief.  If your symptoms do not improving within a week please return for reevaluation.  If anything worsens you need to be seen immediately including chest pain, shortness of breath, nausea/vomiting interfere with oral intake, weakness, worsening cough you need to be seen immediately.

## 2022-05-27 LAB — SARS CORONAVIRUS 2 (TAT 6-24 HRS): SARS Coronavirus 2: NEGATIVE

## 2022-12-01 ENCOUNTER — Emergency Department (HOSPITAL_COMMUNITY): Payer: Medicaid Other

## 2022-12-01 ENCOUNTER — Emergency Department (HOSPITAL_COMMUNITY)
Admission: EM | Admit: 2022-12-01 | Discharge: 2022-12-01 | Disposition: A | Discharge status: Home / Self Care | Payer: Medicaid Other | Attending: Emergency Medicine | Admitting: Emergency Medicine

## 2022-12-01 ENCOUNTER — Other Ambulatory Visit: Payer: Self-pay

## 2022-12-01 ENCOUNTER — Encounter (HOSPITAL_COMMUNITY): Payer: Self-pay

## 2022-12-01 DIAGNOSIS — T148XXA Other injury of unspecified body region, initial encounter: Secondary | ICD-10-CM

## 2022-12-01 DIAGNOSIS — S59912A Unspecified injury of left forearm, initial encounter: Secondary | ICD-10-CM | POA: Diagnosis present

## 2022-12-01 DIAGNOSIS — S51812A Laceration without foreign body of left forearm, initial encounter: Secondary | ICD-10-CM | POA: Insufficient documentation

## 2022-12-01 LAB — CBC
HCT: 45.2 % (ref 39.0–52.0)
Hemoglobin: 15.5 g/dL (ref 13.0–17.0)
MCH: 28.8 pg (ref 26.0–34.0)
MCHC: 34.3 g/dL (ref 30.0–36.0)
MCV: 83.9 fL (ref 80.0–100.0)
Platelets: 278 10*3/uL (ref 150–400)
RBC: 5.39 MIL/uL (ref 4.22–5.81)
RDW: 11.9 % (ref 11.5–15.5)
WBC: 11.7 10*3/uL — ABNORMAL HIGH (ref 4.0–10.5)
nRBC: 0 % (ref 0.0–0.2)

## 2022-12-01 LAB — BASIC METABOLIC PANEL
Anion gap: 16 — ABNORMAL HIGH (ref 5–15)
BUN: 14 mg/dL (ref 6–20)
CO2: 17 mmol/L — ABNORMAL LOW (ref 22–32)
Calcium: 9.5 mg/dL (ref 8.9–10.3)
Chloride: 107 mmol/L (ref 98–111)
Creatinine, Ser: 1.63 mg/dL — ABNORMAL HIGH (ref 0.61–1.24)
GFR, Estimated: 56 mL/min — ABNORMAL LOW (ref >60)
Glucose, Bld: 101 mg/dL — ABNORMAL HIGH (ref 70–99)
Potassium: 3.3 mmol/L — ABNORMAL LOW (ref 3.5–5.1)
Sodium: 140 mmol/L (ref 135–145)

## 2022-12-01 MED ORDER — FENTANYL CITRATE PF 50 MCG/ML IJ SOSY
50.0000 ug | PREFILLED_SYRINGE | Freq: Once | INTRAMUSCULAR | Status: AC
  Administered 2022-12-01: 50 ug via INTRAVENOUS
  Filled 2022-12-01: qty 1

## 2022-12-01 MED ORDER — LACTATED RINGERS IV BOLUS
1000.0000 mL | Freq: Once | INTRAVENOUS | Status: AC
  Administered 2022-12-01: 1000 mL via INTRAVENOUS

## 2022-12-01 MED ORDER — LIDOCAINE HCL (PF) 1 % IJ SOLN
10.0000 mL | Freq: Once | INTRAMUSCULAR | Status: AC
  Administered 2022-12-01: 10 mL
  Filled 2022-12-01: qty 10
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
## 2023-02-10 ENCOUNTER — Ambulatory Visit
Admission: EM | Admit: 2023-02-10 | Discharge: 2023-02-10 | Disposition: A | Discharge status: Home / Self Care | Payer: Medicaid Other | Attending: Internal Medicine | Admitting: Internal Medicine

## 2023-02-10 DIAGNOSIS — F17200 Nicotine dependence, unspecified, uncomplicated: Secondary | ICD-10-CM | POA: Insufficient documentation

## 2023-02-10 DIAGNOSIS — B349 Viral infection, unspecified: Secondary | ICD-10-CM | POA: Insufficient documentation

## 2023-02-10 DIAGNOSIS — Z1152 Encounter for screening for COVID-19: Secondary | ICD-10-CM | POA: Diagnosis not present

## 2023-02-10 MED ORDER — CETIRIZINE HCL 10 MG PO TABS: 10.0000 mg | ORAL_TABLET | Freq: Every day | ORAL | 0 refills | Status: DC

## 2023-02-10 MED ORDER — ACETAMINOPHEN 325 MG PO TABS: 650.0000 mg | ORAL_TABLET | Freq: Four times a day (QID) | ORAL | 0 refills | Status: DC | PRN

## 2023-02-10 MED ORDER — PREDNISONE 20 MG PO TABS: ORAL_TABLET | ORAL | 0 refills | Status: DC

## 2023-02-10 MED ORDER — PROMETHAZINE-DM 6.25-15 MG/5ML PO SYRP: 5.0000 mL | ORAL_SOLUTION | Freq: Four times a day (QID) | ORAL | 0 refills | Status: DC | PRN

## 2023-02-10 NOTE — ED Triage Notes (Signed)
Ptpresents with a cough x 2 days that causes a headache. States it also causes his chest to hurt. Pt woke up this morning and woke up feeling a little better.

## 2023-02-10 NOTE — Discharge Instructions (Signed)
We will notify you of your test results as they arrive and may take between about 24 hours.  I encourage you to sign up for MyChart if you have not already done so as this can be the easiest way for [place] to communicate results to you online or through a phone app.  Generally, we only contact you if it is a positive test result.  In the meantime, if you develop worsening symptoms including fever, chest pain, shortness of breath despite our current treatment plan then please report to the emergency room as this may be a sign of worsening status from possible viral infection.  Otherwise, we will manage this as a viral syndrome. For sore throat or cough try using a honey-based tea. Use 3 teaspoons of honey with juice squeezed from half lemon. Place shaved pieces of ginger into 1/2-1 cup of water and warm over stove top. Then mix the ingredients and repeat every 4 hours as needed. Please take Tylenol 500mg -650mg  every 6 hours for aches and pains, fevers. Hydrate very well with at least 2 liters of water. Eat light meals such as soups to replenish electrolytes and soft fruits, veggies. Start an antihistamine like Zyrtec (10mg  daily) for postnasal drainage, sinus congestion.  You can take this together with prednisone.  Use the cough medications as needed.

## 2023-02-10 NOTE — ED Provider Notes (Signed)
[facility]  Note:  This document was prepared using Conservation officer, historic buildings and may include unintentional dictation errors.  MRN: [id number] DOB: 1986/04/24  Subjective:   Andrew Dorsey is a 37 y.o. male presenting for 2 day history of productive cough that elicits head pain, chest pain. Has throat pain in the mornings. No asthma. Smokes cigarettes. No fever, body aches, sinus symptoms, shob, wheezing. Would like a COVID test.   No current facility-administered medications for this encounter.  Current Outpatient Medications:    fluticasone (FLONASE) 50 MCG/ACT nasal spray, Place 1 spray into both nostrils daily., Disp: 16 g, Rfl: 0   ibuprofen (ADVIL) 600 MG tablet, Take 1 tablet (600 mg total) by mouth every 8 (eight) hours as needed., Disp: 60 tablet, Rfl: 0   promethazine-dextromethorphan (PROMETHAZINE-DM) 6.25-15 MG/5ML syrup, Take 5 mLs by mouth 4 (four) times daily as needed for cough., Disp: 118 mL, Rfl: 0   No Known Allergies  History reviewed. No pertinent past medical history.   Past Surgical History:  Procedure Laterality Date   HERNIA REPAIR      History reviewed. No pertinent family history.  Social History   Tobacco Use   Smoking status: Every Day  Substance Use Topics   Alcohol use: Yes   Drug use: Yes    Types: Marijuana    ROS   Objective:   Vitals: BP 131/81 (BP Location: Right Arm)   Pulse 80   Temp 99 F (37.2 C) (Oral)   Resp 18   SpO2 95%   Physical Exam Constitutional:      General: He is not in acute distress.    Appearance: Normal appearance. He is well-developed and normal weight. He is not ill-appearing, toxic-appearing or diaphoretic.  HENT:     Head: Normocephalic and atraumatic.     Right Ear: External ear normal.     Left Ear: External ear normal.     Nose: Nose normal.     Mouth/Throat:     Mouth: Mucous membranes are moist.     Pharynx: No pharyngeal swelling, oropharyngeal exudate,  posterior oropharyngeal erythema or uvula swelling.     Tonsils: No tonsillar exudate or tonsillar abscesses. 0 on the right. 0 on the left.  Eyes:     General: No scleral icterus.       Right eye: No discharge.        Left eye: No discharge.     Extraocular Movements: Extraocular movements intact.  Cardiovascular:     Rate and Rhythm: Normal rate and regular rhythm.     Heart sounds: Normal heart sounds. No murmur heard.    No friction rub. No gallop.  Pulmonary:     Effort: Pulmonary effort is normal. No respiratory distress.     Breath sounds: Normal breath sounds. No stridor. No wheezing, rhonchi or rales.  Musculoskeletal:     Cervical back: Normal range of motion.  Neurological:     Mental Status: He is alert and oriented to person, place, and time.  Psychiatric:        Mood and Affect: Mood normal.        Behavior: Behavior normal.        Thought Content: Thought content normal.        Judgment: Judgment normal.     Assessment and Plan :   PDMP not reviewed this encounter.  1. Acute viral syndrome   2. Smoker    Deferred imaging given clear  cardiopulmonary exam, hemodynamically stable vital signs. Will manage for viral illness such as viral URI, viral syndrome, viral rhinitis, COVID-19.  Patient requested aggressive management given that he feels his cough is severe and is causing his head to hurt every time he coughs as well as chest pain.  In the context of his smoking and respiratory symptoms, offered a prednisone course.  Otherwise, recommended supportive care. Offered scripts for symptomatic relief. Testing is pending. Counseled patient on potential for adverse effects with medications prescribed/recommended today, ER and return-to-clinic precautions discussed, patient verbalized understanding.   No need for COVID antiviral should he test positive.   Wallis Bamberg, PA-C 02/10/23 1005

## 2023-02-11 LAB — SARS CORONAVIRUS 2 (TAT 6-24 HRS): SARS Coronavirus 2: NEGATIVE

## 2023-09-12 ENCOUNTER — Encounter: Payer: Self-pay | Admitting: Urgent Care

## 2023-09-12 ENCOUNTER — Other Ambulatory Visit (HOSPITAL_COMMUNITY)
Admission: RE | Admit: 2023-09-12 | Discharge: 2023-09-12 | Disposition: A | Discharge status: Home / Self Care | Source: Ambulatory Visit | Attending: Urgent Care | Admitting: Urgent Care

## 2023-09-12 ENCOUNTER — Ambulatory Visit: Admitting: Urgent Care

## 2023-09-12 ENCOUNTER — Ambulatory Visit (INDEPENDENT_AMBULATORY_CARE_PROVIDER_SITE_OTHER): Admitting: Urgent Care

## 2023-09-12 VITALS — BP 134/87 | HR 69 | Ht 70.0 in | Wt 204.0 lb

## 2023-09-12 DIAGNOSIS — Z1329 Encounter for screening for other suspected endocrine disorder: Secondary | ICD-10-CM

## 2023-09-12 DIAGNOSIS — N289 Disorder of kidney and ureter, unspecified: Secondary | ICD-10-CM | POA: Diagnosis not present

## 2023-09-12 DIAGNOSIS — Z720 Tobacco use: Secondary | ICD-10-CM | POA: Diagnosis not present

## 2023-09-12 DIAGNOSIS — Z113 Encounter for screening for infections with a predominantly sexual mode of transmission: Secondary | ICD-10-CM

## 2023-09-12 DIAGNOSIS — L910 Hypertrophic scar: Secondary | ICD-10-CM

## 2023-09-12 DIAGNOSIS — Z131 Encounter for screening for diabetes mellitus: Secondary | ICD-10-CM | POA: Diagnosis not present

## 2023-09-12 DIAGNOSIS — Z8249 Family history of ischemic heart disease and other diseases of the circulatory system: Secondary | ICD-10-CM

## 2023-09-12 DIAGNOSIS — Z130 Encounter for screening for diseases of the blood and blood-forming organs and certain disorders involving the immune mechanism: Secondary | ICD-10-CM

## 2023-09-12 DIAGNOSIS — F4329 Adjustment disorder with other symptoms: Secondary | ICD-10-CM

## 2023-09-12 MED ORDER — BUPROPION HCL ER (SR) 150 MG PO TB12
150.0000 mg | ORAL_TABLET | Freq: Two times a day (BID) | ORAL | 5 refills | Status: AC
Start: 2023-09-12 — End: ?

## 2023-09-12 NOTE — Patient Instructions (Addendum)
 To help you cut back on smoking, please start taking wellbutrin. Start taking 150mg  tablet once daily for the first week. After 7 days, increase to taking it twice daily (morning and night). This should help you stop smoking. It will likely help you with your stress and anxiety levels as well.  Please stay on the medication for three months after you put down your last cigarette to help prevent relapse.  We will notify you of any abnormal lab results.  Return annually, sooner as needed.

## 2023-09-12 NOTE — Progress Notes (Signed)
 New Patient Office Visit  Subjective:  Patient ID: Andrew Dorsey, male    DOB: 1985/06/22  Age: 38 y.o. MRN: [id number]  CC:  Chief Complaint  Patient presents with   Establish Care    New pt est care. He does have some areas on his chest that he developed while incarcerated that he is concerned about.    HPI Andrew Dorsey presents to establish care.  Discussed the use of AI scribe software for clinical note transcription with the patient, who gave verbal consent to proceed.  History of Present Illness   Andrew Dorsey is a 38 year old male who presents for a routine evaluation.  He has developed four or five non-painful bumps on his skin during his nearly seven-year incarceration in [facility]. These have not been previously evaluated.  He underwent surgical removal of a left inguinal hernia in 2018 while incarcerated, with no subsequent issues. 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  He is not currently on any medications.  He is seeking routine blood work and STD screening, with no known exposures or symptoms of STDs. He last ate a steak and cheese sandwich at noon, which may affect cholesterol testing.  He has a family history of high blood pressure but no known family history of high cholesterol or diabetes.  He smokes half a pack of cigarettes daily, having resumed smoking due to stress after quitting for four years. He wants to quit again and would like some assistance in doing so.  He works at [employer] and enjoys working out, [employer], and spending time with his six children, one of whom lives with him.  He reports increased worry and stress due to his mother's recent diagnosis of stage two cervical cancer, which has affected his emotional state but not his sleep or daily functioning.  He had a previous ER visit in July of last year where his kidney function was noted to be off, but he currently  reports feeling well with only normal aches and pains associated with aging.       Outpatient Encounter Medications as of 09/12/2023  Medication Sig   buPROPion (WELLBUTRIN SR) 150 MG 12 hr tablet Take 1 tablet (150 mg total) by mouth 2 (two) times daily.   [DISCONTINUED] acetaminophen (TYLENOL) 325 MG tablet Take 2 tablets (650 mg total) by mouth every 6 (six) hours as needed for moderate pain. (Patient not taking: Reported on 09/12/2023)   [DISCONTINUED] cetirizine (ZYRTEC ALLERGY) 10 MG tablet Take 1 tablet (10 mg total) by mouth daily. (Patient not taking: Reported on 09/12/2023)   [DISCONTINUED] predniSONE (DELTASONE) 20 MG tablet Take 2 tablets daily with breakfast. (Patient not taking: Reported on 09/12/2023)   [DISCONTINUED] promethazine-dextromethorphan (PROMETHAZINE-DM) 6.25-15 MG/5ML syrup Take 5 mLs by mouth 4 (four) times daily as needed for cough. (Patient not taking: Reported on 09/12/2023)   No facility-administered encounter medications on file as of 09/12/2023.    History reviewed. No pertinent past medical history.  Past Surgical History:  Procedure Laterality Date   HERNIA REPAIR  2018   KELOID EXCISION      History reviewed. No pertinent family history.  Social History   Socioeconomic History   Marital status: Legally Separated    Spouse name: Not on file   Number of children: Not on file   Years of education: Not on file   Highest education level: GED or equivalent  Occupational History   Not on file  Tobacco Use   Smoking status: Every Day    Current packs/day: 0.50    Average packs/day: 0.5 packs/day for 10.0 years (5.0 ttl pk-yrs)    Types: Cigarettes   Smokeless tobacco: Never  Substance and Sexual Activity   Alcohol use: Yes    Alcohol/week: 5.0 standard drinks of alcohol    Types: 2 Cans of beer, 3 Shots of liquor per week   Drug use: Yes    Types: Marijuana   Sexual activity: Yes    Birth control/protection: Condom  Other Topics Concern   Not on  file  Social History Narrative   Not on file   Social Drivers of Health   Financial Resource Strain: Low Risk  (09/08/2023)   Overall Financial Resource Strain (CARDIA)    Difficulty of Paying Living Expenses: Not very hard  Food Insecurity: Unknown (09/08/2023)   Hunger Vital Sign    Worried About Running Out of Food in the Last Year: Never true    Ran Out of Food in the Last Year: Patient declined  Transportation Needs: No Transportation Needs (09/08/2023)   PRAPARE - Administrator, Civil Service (Medical): No    Lack of Transportation (Non-Medical): No  Physical Activity: Insufficiently Active (09/08/2023)   Exercise Vital Sign    Days of Exercise per Week: 3 days    Minutes of Exercise per Session: 30 min  Stress: No Stress Concern Present (09/08/2023)   [employer] of Occupational Health - Occupational Stress Questionnaire    Feeling of Stress : Only a little  Social Connections: Moderately Isolated (09/08/2023)   Social Connection and Isolation Panel [NHANES]    Frequency of Communication with Friends and Family: More than three times a week    Frequency of Social Gatherings with Friends and Family: Twice a week    Attends Religious Services: 1 to 4 times per year    Active Member of [employer] or Organizations: No    Attends Engineer, structural: Not on file    Marital Status: Separated  Intimate Partner Violence: Not on file    ROS: as noted in HPI  Objective:  BP 134/87   Pulse 69   Ht 5\' 10"  (1.778 m)   Wt 204 lb (92.5 kg)   SpO2 98%   BMI 29.27 kg/m   Physical Exam Vitals and nursing note reviewed.  Constitutional:      General: He is not in acute distress.    Appearance: Normal appearance. He is not ill-appearing, toxic-appearing or diaphoretic.  HENT:     Head: Normocephalic and atraumatic.     Right Ear: Tympanic membrane, ear canal and external ear normal. There is no impacted cerumen.     Left Ear: Tympanic membrane, ear canal and  external ear normal. There is no impacted cerumen.     Nose: Nose normal.     Mouth/Throat:     Mouth: Mucous membranes are moist.     Pharynx: Oropharynx is clear. No oropharyngeal exudate or posterior oropharyngeal erythema.  Eyes:     General: No scleral icterus.       Right eye: No discharge.        Left eye: No discharge.     Extraocular Movements: Extraocular movements intact.     Pupils: Pupils are equal, round, and reactive to light.  Neck:     Thyroid: No thyroid mass, thyromegaly or thyroid tenderness.  Cardiovascular:  Rate and Rhythm: Normal rate and regular rhythm.     Pulses: Normal pulses.     Heart sounds: No murmur heard. Pulmonary:     Effort: Pulmonary effort is normal. No respiratory distress.     Breath sounds: Normal breath sounds. No stridor. No wheezing or rhonchi.  Chest:    Abdominal:     General: Abdomen is flat. Bowel sounds are normal. There is no distension.     Palpations: Abdomen is soft. There is no mass.     Tenderness: There is no abdominal tenderness. There is no guarding.  Musculoskeletal:     Cervical back: Normal range of motion and neck supple. No rigidity or tenderness.     Right lower leg: No edema.     Left lower leg: No edema.  Lymphadenopathy:     Cervical: No cervical adenopathy.  Skin:    General: Skin is warm and dry.     Coloration: Skin is not jaundiced.     Findings: No bruising, erythema or rash.  Neurological:     General: No focal deficit present.     Mental Status: He is alert and oriented to person, place, and time.     Sensory: No sensory deficit.     Motor: No weakness.  Psychiatric:        Mood and Affect: Mood normal.        Behavior: Behavior normal.     Last CBC Lab Results  Component Value Date   WBC 11.7 (H) 12/01/2022   HGB 15.5 12/01/2022   HCT 45.2 12/01/2022   MCV 83.9 12/01/2022   MCH 28.8 12/01/2022   RDW 11.9 12/01/2022   PLT 278 12/01/2022   Last metabolic panel Lab Results   Component Value Date   GLUCOSE 101 (H) 12/01/2022   NA 140 12/01/2022   K 3.3 (L) 12/01/2022   CL 107 12/01/2022   CO2 17 (L) 12/01/2022   BUN 14 12/01/2022   CREATININE 1.63 (H) 12/01/2022   GFRNONAA 56 (L) 12/01/2022   CALCIUM 9.5 12/01/2022   PROT 7.8 09/13/2012   ALBUMIN 4.5 09/13/2012   BILITOT 0.5 09/13/2012   ALKPHOS 91 09/13/2012   AST 24 09/13/2012   ALT 35 09/13/2012   ANIONGAP 16 (H) 12/01/2022   Last lipids No results found for: "CHOL", "HDL", "LDLCALC", "LDLDIRECT", "TRIG", "CHOLHDL" Last hemoglobin A1c No results found for: "HGBA1C" Last thyroid functions No results found for: "TSH", "T3TOTAL", "T4TOTAL", "THYROIDAB" Last vitamin D No results found for: "25OHVITD2", "25OHVITD3", "VD25OH" Last vitamin B12 and Folate No results found for: "VITAMINB12", "FOLATE"    Assessment & Plan:  Screen for STD (sexually transmitted disease) -     RPR -     HIV Antibody (routine testing w rflx) -     Hepatitis C antibody -     Urine cytology ancillary only  Diabetes mellitus screening -     Hemoglobin A1c -     Comprehensive metabolic panel with GFR  Family history of high blood pressure -     CBC with Differential/Platelet  Smoking trying to quit -     buPROPion HCl ER (SR); Take 1 tablet (150 mg total) by mouth 2 (two) times daily.  Dispense: 60 tablet; Refill: 5  Thyroid disorder screen -     TSH  Screening for deficiency anemia -     CBC with Differential/Platelet  Abnormal renal function -     CBC with Differential/Platelet -     Comprehensive metabolic  panel with GFR  Stress and adjustment reaction -     buPROPion HCl ER (SR); Take 1 tablet (150 mg total) by mouth 2 (two) times daily.  Dispense: 60 tablet; Refill: 5  Keloid scar of skin  Assessment and Plan    Keloid scars Non-painful lesions developed during incarceration. Discussed benign, cosmetic nature of these. - Consider dermatology referral if cosmetic treatment desired.  Tobacco Use  Disorder Smokes half a pack daily, interested in cessation. Previously quit for four years, resumed post-incarceration. Discussed bupropion as cessation aid. - Discuss smoking cessation strategies. - Offer prescription for bupropion.  General Health Maintenance Seeks primary care and routine health maintenance. Concerned about STDs. Recent ER labs showed abnormal kidney function. - Order annual blood work excluding cholesterol. - Perform STD screening including urine test for gonorrhea, chlamydia, trichomonas, and blood tests for HIV, hepatitis C, and syphilis. - Monitor kidney function. - Provide counseling on stress management and mental health resources. - Assist with MyChart access for lab results.        Return in about 1 year (around 09/11/2024) for Annual Physical.   Mandy Second, PA

## 2023-09-13 LAB — CBC WITH DIFFERENTIAL/PLATELET
Basophils Absolute: 0 10*3/uL (ref 0.0–0.1)
Basophils Relative: 0.6 % (ref 0.0–3.0)
Eosinophils Absolute: 0.1 10*3/uL (ref 0.0–0.7)
Eosinophils Relative: 1.9 % (ref 0.0–5.0)
HCT: 43.8 % (ref 39.0–52.0)
Hemoglobin: 14.7 g/dL (ref 13.0–17.0)
Lymphocytes Relative: 38.1 % (ref 12.0–46.0)
Lymphs Abs: 2.5 10*3/uL (ref 0.7–4.0)
MCHC: 33.7 g/dL (ref 30.0–36.0)
MCV: 87.2 fl (ref 78.0–100.0)
Monocytes Absolute: 0.3 10*3/uL (ref 0.1–1.0)
Monocytes Relative: 4.7 % (ref 3.0–12.0)
Neutro Abs: 3.6 10*3/uL (ref 1.4–7.7)
Neutrophils Relative %: 54.7 % (ref 43.0–77.0)
Platelets: 294 10*3/uL (ref 150.0–400.0)
RBC: 5.02 Mil/uL (ref 4.22–5.81)
RDW: 12.8 % (ref 11.5–15.5)
WBC: 6.5 10*3/uL (ref 4.0–10.5)

## 2023-09-13 LAB — COMPREHENSIVE METABOLIC PANEL WITH GFR
ALT: 19 U/L (ref 0–53)
AST: 18 U/L (ref 0–37)
Albumin: 5 g/dL (ref 3.5–5.2)
Alkaline Phosphatase: 70 U/L (ref 39–117)
BUN: 14 mg/dL (ref 6–23)
CO2: 26 meq/L (ref 19–32)
Calcium: 9.8 mg/dL (ref 8.4–10.5)
Chloride: 105 meq/L (ref 96–112)
Creatinine, Ser: 1.07 mg/dL (ref 0.40–1.50)
GFR: 89.68 mL/min (ref >60.00)
Glucose, Bld: 90 mg/dL (ref 70–99)
Potassium: 3.9 meq/L (ref 3.5–5.1)
Sodium: 139 meq/L (ref 135–145)
Total Bilirubin: 0.5 mg/dL (ref 0.2–1.2)
Total Protein: 6.9 g/dL (ref 6.0–8.3)

## 2023-09-13 LAB — HIV ANTIBODY (ROUTINE TESTING W REFLEX): HIV 1&2 Ab, 4th Generation: NONREACTIVE

## 2023-09-13 LAB — HEMOGLOBIN A1C: Hgb A1c MFr Bld: 5.4 % (ref 4.6–6.5)

## 2023-09-13 LAB — HEPATITIS C ANTIBODY: Hepatitis C Ab: NONREACTIVE

## 2023-09-13 LAB — RPR: RPR Ser Ql: NONREACTIVE

## 2023-09-13 LAB — TSH: TSH: 0.95 u[IU]/mL (ref 0.35–5.50)

## 2023-09-14 ENCOUNTER — Encounter: Payer: Self-pay | Admitting: Urgent Care

## 2023-09-14 LAB — URINE CYTOLOGY ANCILLARY ONLY
Chlamydia: NEGATIVE
Comment: NEGATIVE
Comment: NEGATIVE
Comment: NORMAL
Neisseria Gonorrhea: NEGATIVE
Trichomonas: NEGATIVE

## 2024-04-16 ENCOUNTER — Encounter (HOSPITAL_COMMUNITY): Payer: Self-pay

## 2024-04-16 ENCOUNTER — Ambulatory Visit (HOSPITAL_COMMUNITY)
Admission: EM | Admit: 2024-04-16 | Discharge: 2024-04-16 | Disposition: A | Discharge status: Home / Self Care | Attending: Physician Assistant | Admitting: Physician Assistant

## 2024-04-16 DIAGNOSIS — L03116 Cellulitis of left lower limb: Secondary | ICD-10-CM | POA: Diagnosis not present

## 2024-04-16 MED ORDER — CEFTRIAXONE SODIUM 1 G IJ SOLR
INTRAMUSCULAR | Status: AC
  Filled 2024-04-16: qty 10

## 2024-04-16 MED ORDER — LIDOCAINE HCL (PF) 1 % IJ SOLN
INTRAMUSCULAR | Status: AC
  Filled 2024-04-16: qty 2

## 2024-04-16 MED ORDER — DOXYCYCLINE HYCLATE 100 MG PO CAPS: 100.0000 mg | ORAL_CAPSULE | Freq: Two times a day (BID) | ORAL | 0 refills | Status: AC

## 2024-04-16 MED ORDER — CEFTRIAXONE SODIUM 1 G IJ SOLR
1.0000 g | INTRAMUSCULAR | Status: DC
  Administered 2024-04-16: 1 g via INTRAMUSCULAR

## 2024-04-16 NOTE — ED Triage Notes (Signed)
 PT states L-inner thigh pain and swelling the started yesterday after slip and fall yesterday.  Patient states he can feel a large knot on his thigh. SABRA

## 2024-04-16 NOTE — ED Provider Notes (Signed)
 [facility]    CSN: [id number] Arrival date & time: 04/16/24  0831      History   Chief Complaint Chief Complaint  Patient presents with   Leg Pain    HPI Andrew Dorsey is a 38 y.o. male.   Patient complains of a swollen painful area left inner thigh.  Patient states he started out with a small area of redness that has spread.  Patient thinks that it started with a pimple.  Patient complains of having felt like he had a fever and feeling achy.  Patient denies any nausea or vomiting  The history is provided by the patient. No language interpreter was used.  Leg Pain   History reviewed. No pertinent past medical history.  There are no active problems to display for this patient.   Past Surgical History:  Procedure Laterality Date   HERNIA REPAIR  2018   KELOID EXCISION         Home Medications    Prior to Admission medications   Medication Sig Start Date End Date Taking? Authorizing Provider  doxycycline (VIBRAMYCIN) 100 MG capsule Take 1 capsule (100 mg total) by mouth 2 (two) times daily. 04/16/24  Yes Flint Raring K, PA-C  buPROPion  (WELLBUTRIN  SR) 150 MG 12 hr tablet Take 1 tablet (150 mg total) by mouth 2 (two) times daily. 09/12/23   Lowella Benton CROME, PA    Family History History reviewed. No pertinent family history.  Social History Social History   Tobacco Use   Smoking status: Every Day    Current packs/day: 0.50    Average packs/day: 0.5 packs/day for 10.0 years (5.0 ttl pk-yrs)    Types: Cigarettes   Smokeless tobacco: Never  Substance Use Topics   Alcohol use: Yes    Alcohol/week: 5.0 standard drinks of alcohol    Types: 2 Cans of beer, 3 Shots of liquor per week   Drug use: Yes    Types: Marijuana     Allergies   Patient has no known allergies.   Review of Systems Review of Systems  All other systems reviewed and are negative.    Physical Exam Triage Vital Signs ED Triage Vitals [04/16/24 0950]  Encounter Vitals  Group     BP 129/78     Girls Systolic BP Percentile      Girls Diastolic BP Percentile      Boys Systolic BP Percentile      Boys Diastolic BP Percentile      Pulse Rate 87     Resp 20     Temp 98.5 F (36.9 C)     Temp Source Oral     SpO2 94 %     Weight      Height      Head Circumference      Peak Flow      Pain Score      Pain Loc      Pain Education      Exclude from Growth Chart    No data found.  Updated Vital Signs BP 129/78 (BP Location: Left Arm)   Pulse 87   Temp 98.5 F (36.9 C) (Oral)   Resp 20   SpO2 94%   Visual Acuity Right Eye Distance:   Left Eye Distance:   Bilateral Distance:    Right Eye Near:   Left Eye Near:    Bilateral Near:     Physical Exam Vitals and nursing note reviewed.  Constitutional:  Appearance: He is well-developed.  HENT:     Head: Normocephalic.  Cardiovascular:     Rate and Rhythm: Normal rate.  Pulmonary:     Effort: Pulmonary effort is normal.  Abdominal:     General: There is no distension.  Musculoskeletal:        General: Swelling and tenderness present.     Comments: Tender red area left inner thigh area.   Skin:    General: Skin is warm.  Neurological:     General: No focal deficit present.     Mental Status: He is alert and oriented to person, place, and time.      UC Treatments / Results  Labs (all labs ordered are listed, but only abnormal results are displayed) Labs Reviewed - No data to display  EKG   Radiology No results found.  Procedures Procedures (including critical care time)  Medications Ordered in UC Medications  cefTRIAXone (ROCEPHIN) injection 1 g (has no administration in time range)    Initial Impression / Assessment and Plan / UC Course  I have reviewed the triage vital signs and the nursing notes.  Pertinent labs & imaging results that were available during my care of the patient were reviewed by me and considered in my medical decision making (see chart for  details).      Final Clinical Impressions(s) / UC Diagnoses   Final diagnoses:  Cellulitis of left lower extremity     Discharge Instructions      Return if any problems.    ED Prescriptions     Medication Sig Dispense Auth. Provider   doxycycline (VIBRAMYCIN) 100 MG capsule Take 1 capsule (100 mg total) by mouth 2 (two) times daily. 20 capsule Kerly Rigsbee K, PA-C      PDMP not reviewed this encounter. An After Visit Summary was printed and given to the patient.       Flint Sonny POUR, PA-C 04/16/24 1043

## 2024-04-16 NOTE — Discharge Instructions (Signed)
 Return if any problems.

## 2024-04-16 NOTE — ED Triage Notes (Signed)
 PT is taking Motrin  with some relief.

## 2024-09-12 ENCOUNTER — Encounter: Admitting: Urgent Care
